# Patient Record
Sex: Female | Born: 1970 | Race: White | Hispanic: No | Marital: Married | Smoking: Current every day smoker
Health system: Southern US, Community
[De-identification: ages and names within clinical notes are randomized; demographics above are authoritative.]

## PROBLEM LIST (undated history)

## (undated) DIAGNOSIS — I10 Essential (primary) hypertension: Secondary | ICD-10-CM

## (undated) HISTORY — PX: OTHER SURGICAL HISTORY: SHX169

---

## 2004-09-01 ENCOUNTER — Emergency Department (HOSPITAL_COMMUNITY): Admission: EM | Admit: 2004-09-01 | Discharge: 2004-09-02 | Payer: Self-pay | Admitting: Emergency Medicine

## 2005-03-29 ENCOUNTER — Other Ambulatory Visit: Admission: RE | Admit: 2005-03-29 | Discharge: 2005-03-29 | Payer: Self-pay | Admitting: Obstetrics and Gynecology

## 2005-04-14 ENCOUNTER — Ambulatory Visit (HOSPITAL_COMMUNITY): Admission: RE | Admit: 2005-04-14 | Discharge: 2005-04-14 | Payer: Self-pay | Admitting: Obstetrics and Gynecology

## 2005-09-20 ENCOUNTER — Ambulatory Visit (HOSPITAL_COMMUNITY): Admission: RE | Admit: 2005-09-20 | Discharge: 2005-09-20 | Payer: Self-pay | Admitting: Obstetrics and Gynecology

## 2006-05-15 ENCOUNTER — Inpatient Hospital Stay (HOSPITAL_COMMUNITY): Admission: AD | Admit: 2006-05-15 | Discharge: 2006-05-15 | Payer: Self-pay | Admitting: Obstetrics and Gynecology

## 2006-08-01 ENCOUNTER — Ambulatory Visit (HOSPITAL_COMMUNITY): Admission: RE | Admit: 2006-08-01 | Discharge: 2006-08-01 | Payer: Self-pay | Admitting: Obstetrics and Gynecology

## 2006-10-15 ENCOUNTER — Inpatient Hospital Stay (HOSPITAL_COMMUNITY): Admission: RE | Admit: 2006-10-15 | Discharge: 2006-10-18 | Payer: Self-pay | Admitting: Obstetrics and Gynecology

## 2008-01-16 IMAGING — US US OB COMP +14 WK
1 series · 13 of 28 positions shown · non-contrast
Comparison: none

CLINICAL DATA: 17 week 2 day gestational age by LMP.  Fell down stairs.  Pain and cramping.  Evaluate fetus and placenta.

[Series 1: us ob comp +14 wk · 0.26mm/px · 13 of 95 slices shown]
[im 4/95]
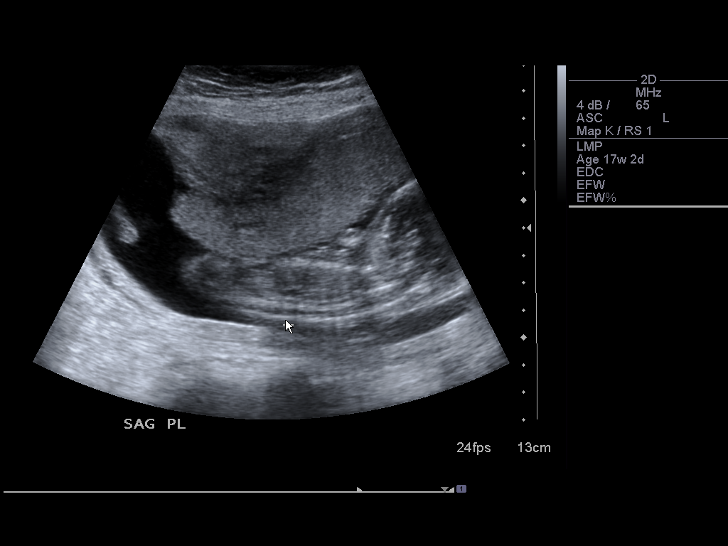
[im 11/95]
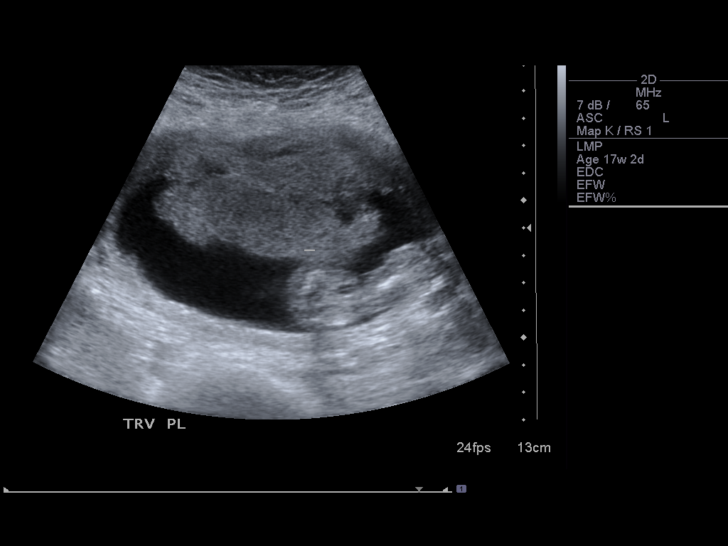
[im 18/95]
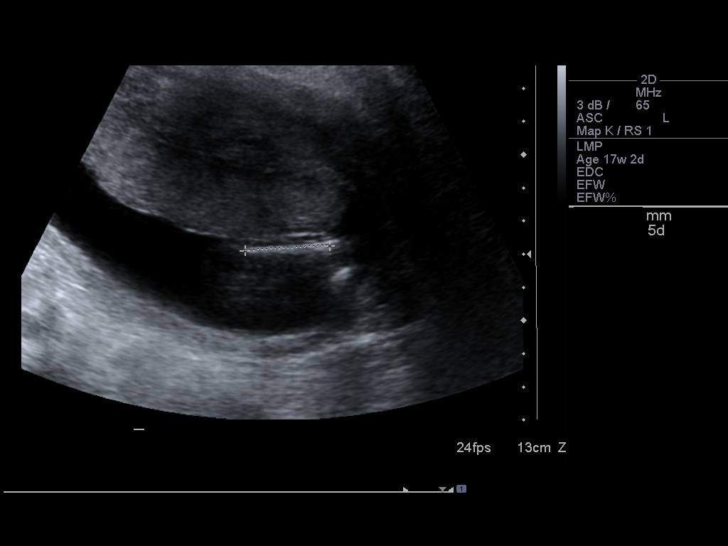
[im 25/95]
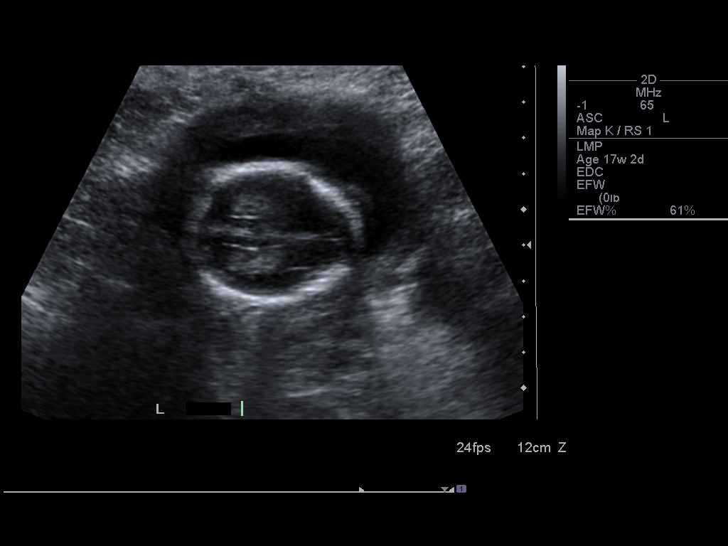
[im 32/95]
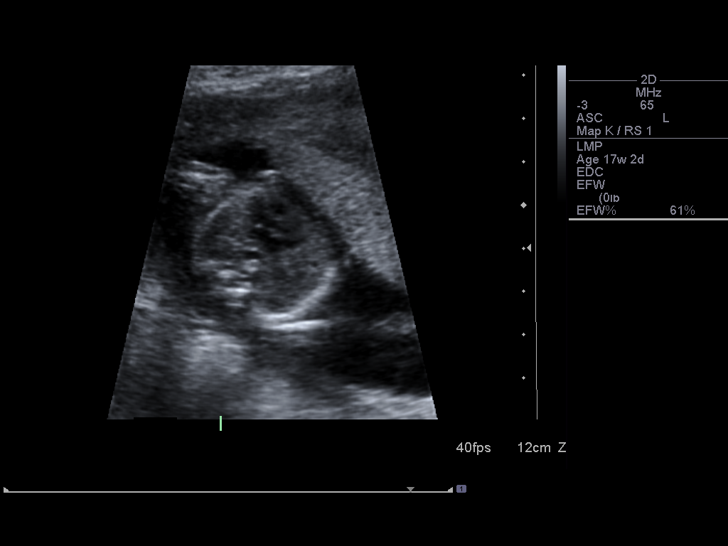
[im 39/95]
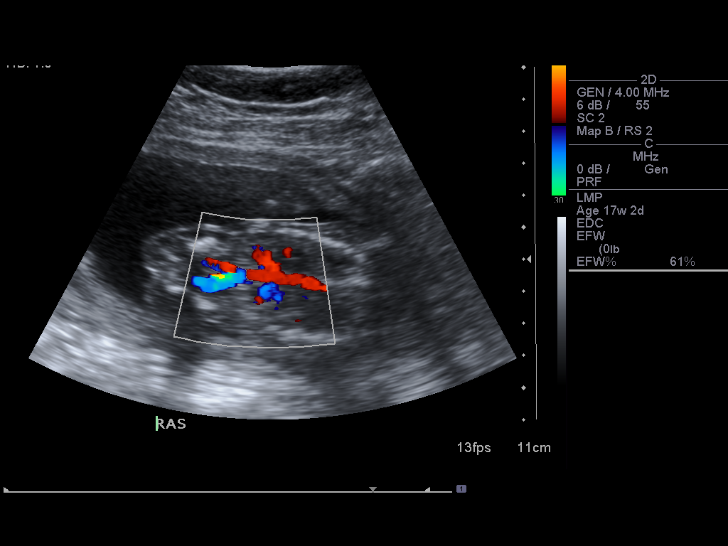
[im 49/95]
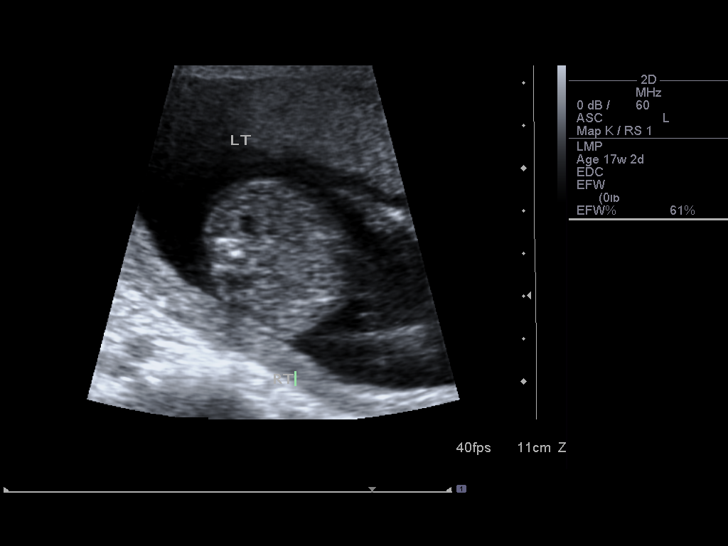
[im 56/95]
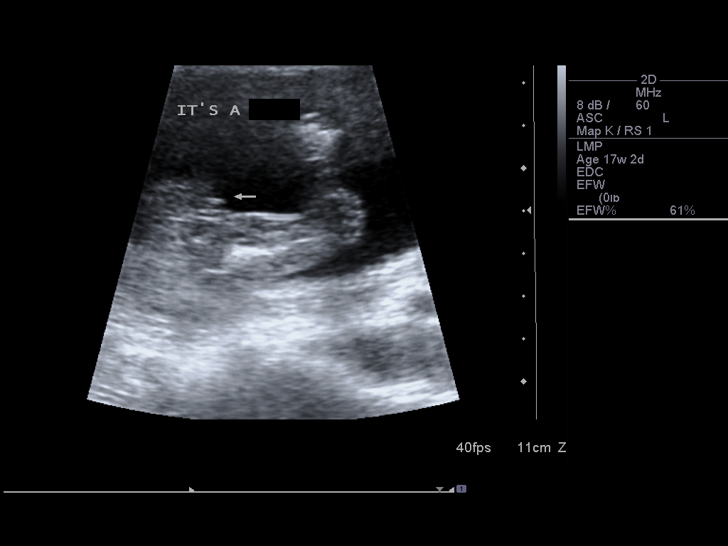
[im 63/95]
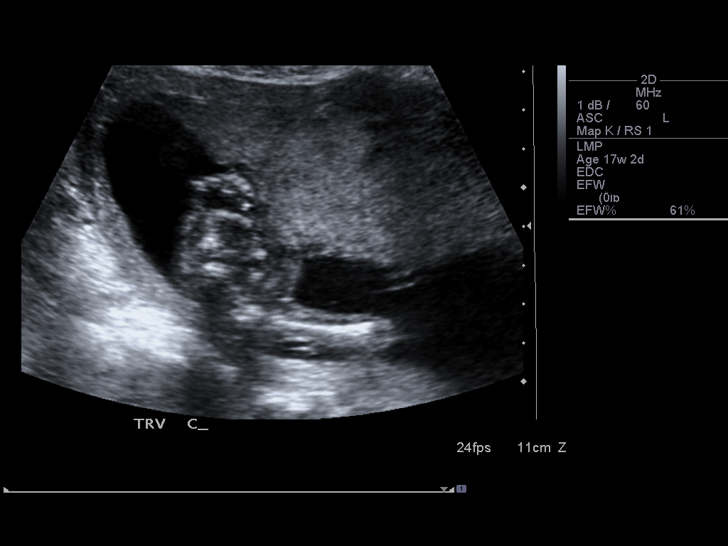
[im 70/95]
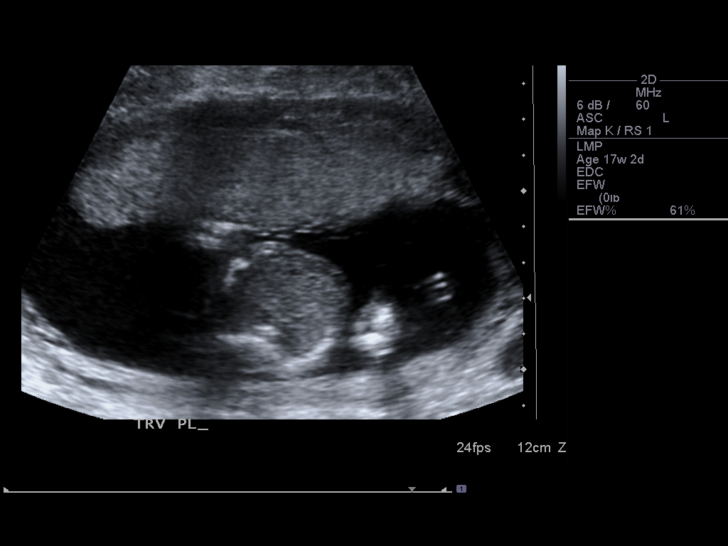
[im 77/95]
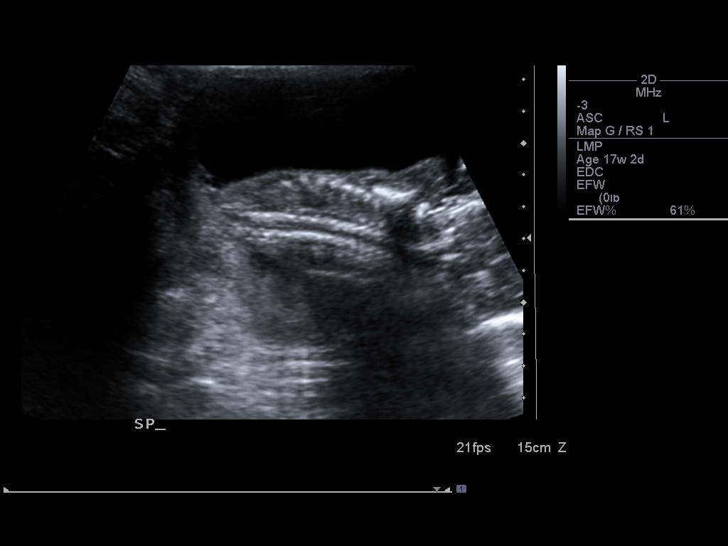
[im 84/95]
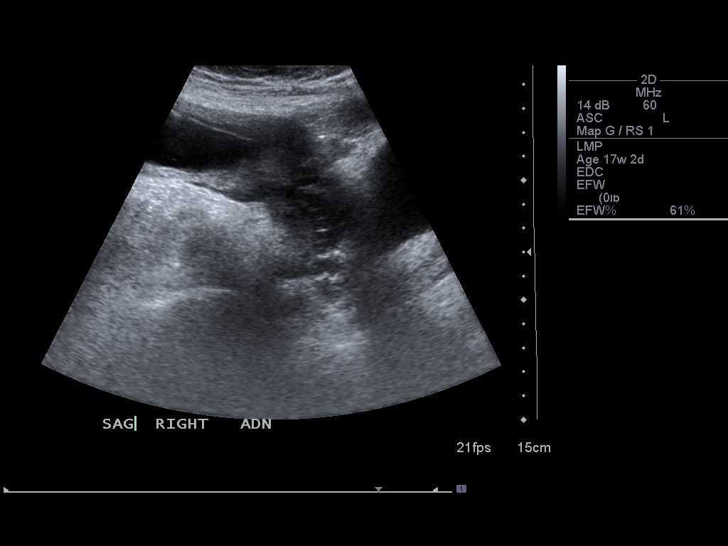
[im 91/95]
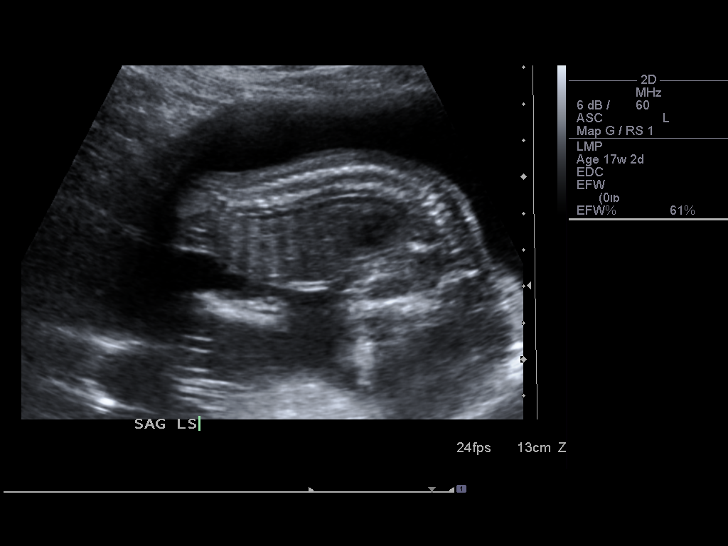

[13 of 28 positions shown; findings below may reference images not displayed]

OBSTETRICAL ULTRASOUND:
 Number of Fetuses:  1
 Heart Rate:  147 bpm
 Movement:  Yes
 Breathing:  No
 Presentation:  Cephalic
 Placental Location:  Anterior
 Grade:  I
 Previa:  No
 Amniotic Fluid (Subjective):  Normal
 Amniotic Fluid (Objective):  4.0 cm vertical pocket 

 FETAL BIOMETRY
 BPD:  3.9 cm   17 w 6 d 
 HC:  14.6 cm   17 w 5 d 
 AC:  11.8 cm   17 w 4 d 
 FL:  2.5 cm   17 w 3 d 

 MEAN GA:  17 w 5 d  US EDC:  10/18/06
 Assigned GA:  17 w 2 d  Assigned EDC:  10/21/06

 FETAL ANATOMY
 Lateral Ventricles:  Visualized 
 Thalami/CSP:  Visualized 
 Posterior Fossa:  Visualized 
 Nuchal Region:  NF= 2.8 mm   Visualized 
 Spine:  Visualized 
 4 Chamber Heart on Left:  Visualized 
 Stomach on Left:  Visualized 
 3 Vessel Cord:  Visualized 
 Cord Insertion site:  Visualized 
 Kidneys:  Visualized 
 Bladder:  Visualized 
 Extremities:  Visualized 

 ADDITIONAL ANATOMY VISUALIZED:  LVOT, RVOT, orbits, profile, diaphragm, heel, 5th digit, ductal arch, and aortic arch.

 MATERNAL UTERINE AND ADNEXAL FINDINGS
 Cervix:  3.3 cm transabdominal.  

 No placental abruption identified.  No adnexal mass or free fluid noted.
IMPRESSION: 1.  Single living intrauterine fetus with mean gestational age of 17 weeks 5 days and US EDC of 10/18/06.  This is concordant with LMP.  
 2.  No evidence of fetal anatomic abnormality.  
 3.  No placental abnormality identified.  Normal amniotic fluid volume and cervical length.

## 2011-11-02 ENCOUNTER — Emergency Department (HOSPITAL_BASED_OUTPATIENT_CLINIC_OR_DEPARTMENT_OTHER)
Admission: EM | Admit: 2011-11-02 | Discharge: 2011-11-03 | Disposition: A | Discharge status: Home / Self Care | Payer: PRIVATE HEALTH INSURANCE | Attending: Emergency Medicine | Admitting: Emergency Medicine

## 2011-11-02 ENCOUNTER — Encounter (HOSPITAL_BASED_OUTPATIENT_CLINIC_OR_DEPARTMENT_OTHER): Payer: Self-pay | Admitting: *Deleted

## 2011-11-02 ENCOUNTER — Emergency Department (HOSPITAL_BASED_OUTPATIENT_CLINIC_OR_DEPARTMENT_OTHER): Payer: PRIVATE HEALTH INSURANCE

## 2011-11-02 DIAGNOSIS — S2249XA Multiple fractures of ribs, unspecified side, initial encounter for closed fracture: Secondary | ICD-10-CM | POA: Insufficient documentation

## 2011-11-02 DIAGNOSIS — Y9289 Other specified places as the place of occurrence of the external cause: Secondary | ICD-10-CM | POA: Insufficient documentation

## 2011-11-02 DIAGNOSIS — S2242XA Multiple fractures of ribs, left side, initial encounter for closed fracture: Secondary | ICD-10-CM

## 2011-11-02 DIAGNOSIS — I10 Essential (primary) hypertension: Secondary | ICD-10-CM | POA: Insufficient documentation

## 2011-11-02 DIAGNOSIS — W010XXA Fall on same level from slipping, tripping and stumbling without subsequent striking against object, initial encounter: Secondary | ICD-10-CM | POA: Insufficient documentation

## 2011-11-02 DIAGNOSIS — F17200 Nicotine dependence, unspecified, uncomplicated: Secondary | ICD-10-CM | POA: Insufficient documentation

## 2011-11-02 HISTORY — DX: Essential (primary) hypertension: I10

## 2011-11-02 MED ORDER — OXYCODONE-ACETAMINOPHEN 5-325 MG PO TABS: 2.0000 | ORAL_TABLET | Freq: Four times a day (QID) | ORAL | Status: AC | PRN

## 2011-11-02 MED ORDER — KETOROLAC TROMETHAMINE 60 MG/2ML IM SOLN
60.0000 mg | Freq: Once | INTRAMUSCULAR | Status: AC
  Administered 2011-11-02: 60 mg via INTRAMUSCULAR
  Filled 2011-11-02: qty 2

## 2011-11-02 MED ORDER — IBUPROFEN 800 MG PO TABS: 800.0000 mg | ORAL_TABLET | Freq: Three times a day (TID) | ORAL | Status: AC | PRN

## 2011-11-02 MED ORDER — MORPHINE SULFATE 4 MG/ML IJ SOLN
4.0000 mg | Freq: Once | INTRAMUSCULAR | Status: AC
  Administered 2011-11-02: 4 mg via INTRAMUSCULAR
  Filled 2011-11-02: qty 1

## 2011-11-02 MED ORDER — OXYCODONE-ACETAMINOPHEN 5-325 MG PO TABS
2.0000 | ORAL_TABLET | Freq: Once | ORAL | Status: AC
  Administered 2011-11-02: 2 via ORAL
  Filled 2011-11-02: qty 2

## 2011-11-02 NOTE — Discharge Instructions (Signed)
Rib Fracture Your caregiver has diagnosed you as having a rib fracture (a break). This can occur by a blow to the chest, by a fall against a hard object, or by violent coughing or sneezing. There may be one or many breaks. Rib fractures may heal on their own within 3 to 8 weeks. The longer healing period is usually associated with a continued cough or other aggravating activities. HOME CARE INSTRUCTIONS   Avoid strenuous activity. Be careful during activities and avoid bumping the injured rib. Activities that cause pain pull on the fracture site(s) and are best avoided if possible.   Eat a normal, well-balanced diet. Drink plenty of fluids to avoid constipation.   Take deep breaths several times a day to keep lungs free of infection. Try to cough several times a day, splinting the injured area with a pillow. This will help prevent pneumonia.   Do not wear a rib belt or binder. These restrict breathing which can lead to pneumonia.   Only take over-the-counter or prescription medicines for pain, discomfort, or fever as directed by your caregiver.  SEEK MEDICAL CARE IF:  You develop a continual cough, associated with thick or bloody sputum. SEEK IMMEDIATE MEDICAL CARE IF:   You have a fever.   You have difficulty breathing.   You have nausea (feeling sick to your stomach), vomiting, or abdominal (belly) pain.   You have worsening pain, not controlled with medications.  Document Released: 05/14/2005 Document Revised: 05/03/2011 Document Reviewed: 10/16/2006 [employer] Patient Information 2012 [employer]. 

## 2011-11-02 NOTE — Patient Instructions (Signed)
Instructed pt on the proper use of an incentive spirometry. Pt achieved x 10. Pt instructed to use 10 times while awake. Pt tolerated well.

## 2011-11-02 NOTE — ED Notes (Addendum)
Fell at the pool an hour ago and hit her left  ribs.  Pain. States she is having difficulty breathing.

## 2011-11-03 NOTE — ED Provider Notes (Signed)
History     CSN: [id number]  Arrival date & time 11/02/11  2056   First MD Initiated Contact with Patient 11/02/11 2154      Chief Complaint  Patient presents with  . Fall    (Consider location/radiation/quality/duration/timing/severity/associated sxs/prior treatment) HPI Patient is a 41 year old female who presents today complaining of 10 out of 10 left lower chest wall pain after having a fall on the side of a pool while joking around with her husband. Patient landed on concrete on her left side when she slipped and fell. She endorses pain that is worse with deep inspiration. She is not having any difficulty breathing and was hemodynamically stable upon arrival. She denies any abdominal pain, lightheadedness, or shortness of breath. Patient denies any other injuries. Pain is worse with breathing and movement as well as palpation. Nothing has made it better. There are no other associated or modifying factors. Past Medical History  Diagnosis Date  . Hypertension     Past Surgical History  Procedure Date  . C sections     History reviewed. No pertinent family history.  History  Substance Use Topics  . Smoking status: Current Everyday Smoker -- 0.5 packs/day  . Smokeless tobacco: Not on file  . Alcohol Use: Yes    OB History    Grav Para Term Preterm Abortions TAB SAB Ect Mult Living                  Review of Systems  Constitutional: Negative.   HENT: Negative.   Eyes: Negative.   Respiratory: Negative.   Cardiovascular: Positive for chest pain.  Gastrointestinal: Negative.   Genitourinary: Negative.   Musculoskeletal: Negative.   Skin: Negative.   Neurological: Negative.   Hematological: Negative.   Psychiatric/Behavioral: Negative.   All other systems reviewed and are negative.    Allergies  Review of patient's allergies indicates no known allergies.  Home Medications   Current Outpatient Rx  Name Route Sig Dispense Refill  . HYDROCHLOROTHIAZIDE 25 MG  PO TABS Oral Take 25 mg by mouth daily.    . VENLAFAXINE HCL 75 MG PO TABS Oral Take 75 mg by mouth 2 (two) times daily.    . IBUPROFEN 800 MG PO TABS Oral Take 1 tablet (800 mg total) by mouth every 8 (eight) hours as needed for pain. 30 tablet 0  . OXYCODONE-ACETAMINOPHEN 5-325 MG PO TABS Oral Take 2 tablets by mouth every 6 (six) hours as needed for pain. 30 tablet 0    BP 106/60  Pulse 76  Temp(Src) 98.3 F (36.8 C) (Oral)  Resp 18  SpO2 98%  Physical Exam  Nursing note and vitals reviewed. GEN: Well-developed, well-nourished female in no distress, uncomfortable appearing HEENT: Atraumatic, normocephalic. Oropharynx clear without erythema EYES: PERRLA BL, no scleral icterus. NECK: Trachea midline, no meningismus CV: regular rate and rhythm. No murmurs, rubs, or gallops. Tender to palpation over the left lower chest wall with no crepitus or evidence of flail chest PULM: No respiratory distress.  No crackles, wheezes, or rales. GI: soft, non-tender. No guarding, rebound, or tenderness. + bowel sounds  GU: deferred Neuro: cranial nerves 2-12 intact, no abnormalities of strength or sensation, A and O x 3 MSK: Patient moves all 4 extremities symmetrically, no deformity, edema, or injury noted Skin: No rashes petechiae, purpura, or jaundice Psych: no abnormality of mood   ED Course  Procedures (including critical care time)  Labs Reviewed - No data to display Dg Ribs Unilateral W/chest Left  11/02/2011  *RADIOLOGY REPORT*  Clinical Data: Chest/left rib pain  LEFT RIBS AND CHEST - 3+ VIEW  Comparison: None.  Findings: Lungs are clear. No pleural effusion or pneumothorax.  Cardiomediastinal silhouette is within normal limits.  Nondisplaced left lateral fifth rib fracture.  Mildly displaced left lateral sixth rib fracture.  IMPRESSION: Left lateral fifth and sixth rib fractures.  Original Report Authenticated By: Charline Bills, M.D.     1. Multiple fractures of ribs of left side         MDM  Patient was evaluated by myself. Plain films of the left ribs were performed. Fractures of both the left fifth and sixth rib were noted. Patient was treated for her pain with IM Toradol and morphine. Pain was significantly improved. She was given 2 tabs of Percocet and instructed on the use of an incentive spirometer. Patient had equal breath sounds bilaterally and did not have presentation consistent with pneumothorax. No pneumothorax is noted on rib film as well. Patient also did not have any abdominal pain concerning for splenic injury. She had normal vital signs and was not lightheaded or short of breath. Patient was discharged with prescriptions for high-dose ibuprofen and narcotic pain medication. She was instructed on the use of incentive spirometer and told to return if she develops fevers or difficulty breathing. Patient can followup with her primary care physician and was discharged in good condition        Cyndra Numbers, MD 11/03/11 [phone]

## 2016-08-20 DIAGNOSIS — G43009 Migraine without aura, not intractable, without status migrainosus: Secondary | ICD-10-CM | POA: Diagnosis not present

## 2016-08-20 DIAGNOSIS — J309 Allergic rhinitis, unspecified: Secondary | ICD-10-CM | POA: Diagnosis not present

## 2017-02-04 DIAGNOSIS — Z79899 Other long term (current) drug therapy: Secondary | ICD-10-CM | POA: Diagnosis not present

## 2017-02-04 DIAGNOSIS — Z0000 Encounter for general adult medical examination without abnormal findings: Secondary | ICD-10-CM | POA: Diagnosis not present

## 2017-02-04 DIAGNOSIS — G43009 Migraine without aura, not intractable, without status migrainosus: Secondary | ICD-10-CM | POA: Diagnosis not present

## 2017-02-04 DIAGNOSIS — J309 Allergic rhinitis, unspecified: Secondary | ICD-10-CM | POA: Diagnosis not present

## 2017-06-27 DIAGNOSIS — N3001 Acute cystitis with hematuria: Secondary | ICD-10-CM | POA: Diagnosis not present

## 2017-06-27 DIAGNOSIS — R300 Dysuria: Secondary | ICD-10-CM | POA: Diagnosis not present

## 2017-08-02 DIAGNOSIS — Z719 Counseling, unspecified: Secondary | ICD-10-CM | POA: Insufficient documentation

## 2017-08-05 DIAGNOSIS — J309 Allergic rhinitis, unspecified: Secondary | ICD-10-CM | POA: Diagnosis not present

## 2017-08-05 DIAGNOSIS — G43009 Migraine without aura, not intractable, without status migrainosus: Secondary | ICD-10-CM | POA: Diagnosis not present

## 2018-02-18 DIAGNOSIS — J309 Allergic rhinitis, unspecified: Secondary | ICD-10-CM | POA: Diagnosis not present

## 2018-02-18 DIAGNOSIS — G43009 Migraine without aura, not intractable, without status migrainosus: Secondary | ICD-10-CM | POA: Diagnosis not present

## 2018-03-07 DIAGNOSIS — Z1231 Encounter for screening mammogram for malignant neoplasm of breast: Secondary | ICD-10-CM | POA: Diagnosis not present

## 2018-03-07 DIAGNOSIS — Z01419 Encounter for gynecological examination (general) (routine) without abnormal findings: Secondary | ICD-10-CM | POA: Diagnosis not present

## 2018-03-07 DIAGNOSIS — Z6822 Body mass index (BMI) 22.0-22.9, adult: Secondary | ICD-10-CM | POA: Diagnosis not present

## 2018-03-21 DIAGNOSIS — N3001 Acute cystitis with hematuria: Secondary | ICD-10-CM | POA: Diagnosis not present

## 2018-03-21 DIAGNOSIS — B373 Candidiasis of vulva and vagina: Secondary | ICD-10-CM | POA: Diagnosis not present

## 2018-03-21 DIAGNOSIS — R300 Dysuria: Secondary | ICD-10-CM | POA: Diagnosis not present

## 2020-05-24 NOTE — Progress Notes (Deleted)
    Patient referred by Aretta Nip, MD for ***  Subjective:   Kelsey Skinner, female    DOB: 10-09-1970, 49 y.o.   MRN: [id number]  *** No chief complaint on file.   *** HPI  49 y.o. *** female with ***  *** Past Medical History:  Diagnosis Date  . Hypertension     *** Past Surgical History:  Procedure Laterality Date  . c sections      *** Social History   Tobacco Use  Smoking Status Current Every Day Smoker  . Packs/day: 0.50  Smokeless Tobacco Not on file    Social History   Substance and Sexual Activity  Alcohol Use Yes    *** No family history on file.  *** Current Outpatient Medications on File Prior to Visit  Medication Sig Dispense Refill  . hydrochlorothiazide (HYDRODIURIL) 25 MG tablet Take 25 mg by mouth daily.    Marland Kitchen venlafaxine (EFFEXOR) 75 MG tablet Take 75 mg by mouth 2 (two) times daily.     No current facility-administered medications on file prior to visit.    Cardiovascular and other pertinent studies:  *** EKG 05/17/2020: ***  *** Recent labs: 05/06/2020: Glucose 82, BUN/Cr 10/0.90. EGFR 76. Na/K 141/4.2. ***Rest of the CMP normal H/H 14.2/41.0. MCV 90. Platelets 327 ***HbA1C 4.8 % Chol 231, TG 57, HDL 111, LDL 110 ***TSH 0.836 normal   *** ROS      *** There were no vitals filed for this visit.   There is no height or weight on file to calculate BMI. There were no vitals filed for this visit.  *** Objective:   Physical Exam    ***     Assessment & Recommendations:   ***  ***  Thank you for referring the patient to [place]. Please feel free to contact with any questions.   Nigel Mormon, MD Pager: [phone] Office: [phone]

## 2020-05-25 ENCOUNTER — Ambulatory Visit: Payer: Self-pay | Admitting: Cardiology

## 2020-06-03 DIAGNOSIS — J309 Allergic rhinitis, unspecified: Secondary | ICD-10-CM | POA: Insufficient documentation

## 2020-06-03 DIAGNOSIS — G43009 Migraine without aura, not intractable, without status migrainosus: Secondary | ICD-10-CM | POA: Insufficient documentation

## 2020-06-03 DIAGNOSIS — I10 Essential (primary) hypertension: Secondary | ICD-10-CM | POA: Insufficient documentation

## 2020-06-03 DIAGNOSIS — F900 Attention-deficit hyperactivity disorder, predominantly inattentive type: Secondary | ICD-10-CM | POA: Insufficient documentation

## 2020-06-03 DIAGNOSIS — R9431 Abnormal electrocardiogram [ECG] [EKG]: Secondary | ICD-10-CM | POA: Insufficient documentation

## 2020-06-03 NOTE — Progress Notes (Signed)
No show

## 2020-06-06 ENCOUNTER — Ambulatory Visit: Payer: Self-pay | Admitting: Cardiology

## 2020-06-06 DIAGNOSIS — R9431 Abnormal electrocardiogram [ECG] [EKG]: Secondary | ICD-10-CM

## 2023-07-03 DIAGNOSIS — Z124 Encounter for screening for malignant neoplasm of cervix: Secondary | ICD-10-CM | POA: Diagnosis not present

## 2023-07-03 DIAGNOSIS — J309 Allergic rhinitis, unspecified: Secondary | ICD-10-CM | POA: Diagnosis not present

## 2023-07-03 DIAGNOSIS — Z0000 Encounter for general adult medical examination without abnormal findings: Secondary | ICD-10-CM | POA: Diagnosis not present

## 2023-07-03 DIAGNOSIS — I10 Essential (primary) hypertension: Secondary | ICD-10-CM | POA: Diagnosis not present

## 2023-07-03 DIAGNOSIS — F900 Attention-deficit hyperactivity disorder, predominantly inattentive type: Secondary | ICD-10-CM | POA: Diagnosis not present

## 2023-07-03 DIAGNOSIS — E039 Hypothyroidism, unspecified: Secondary | ICD-10-CM | POA: Diagnosis not present

## 2024-03-19 DIAGNOSIS — Z1211 Encounter for screening for malignant neoplasm of colon: Secondary | ICD-10-CM | POA: Diagnosis not present
